# Patient Record
Sex: Male | Born: 1940 | Race: White | Hispanic: No | Marital: Married | Smoking: Never smoker
Health system: Southern US, Community
[De-identification: ages and names within clinical notes are randomized; demographics above are authoritative.]

## PROBLEM LIST (undated history)

## (undated) DIAGNOSIS — E7800 Pure hypercholesterolemia, unspecified: Secondary | ICD-10-CM

## (undated) DIAGNOSIS — I10 Essential (primary) hypertension: Secondary | ICD-10-CM

---

## 2017-07-30 ENCOUNTER — Emergency Department (HOSPITAL_COMMUNITY)
Admission: EM | Admit: 2017-07-30 | Discharge: 2017-07-30 | Disposition: A | Discharge status: Home / Self Care | Payer: Medicare Other | Attending: Emergency Medicine | Admitting: Emergency Medicine

## 2017-07-30 ENCOUNTER — Emergency Department (HOSPITAL_COMMUNITY): Payer: Medicare Other

## 2017-07-30 ENCOUNTER — Encounter (HOSPITAL_COMMUNITY): Payer: Self-pay | Admitting: *Deleted

## 2017-07-30 DIAGNOSIS — Y9389 Activity, other specified: Secondary | ICD-10-CM | POA: Insufficient documentation

## 2017-07-30 DIAGNOSIS — Y92009 Unspecified place in unspecified non-institutional (private) residence as the place of occurrence of the external cause: Secondary | ICD-10-CM | POA: Diagnosis not present

## 2017-07-30 DIAGNOSIS — Z79899 Other long term (current) drug therapy: Secondary | ICD-10-CM | POA: Insufficient documentation

## 2017-07-30 DIAGNOSIS — Y999 Unspecified external cause status: Secondary | ICD-10-CM | POA: Diagnosis not present

## 2017-07-30 DIAGNOSIS — S6992XA Unspecified injury of left wrist, hand and finger(s), initial encounter: Secondary | ICD-10-CM | POA: Insufficient documentation

## 2017-07-30 DIAGNOSIS — W2209XA Striking against other stationary object, initial encounter: Secondary | ICD-10-CM | POA: Diagnosis not present

## 2017-07-30 DIAGNOSIS — I10 Essential (primary) hypertension: Secondary | ICD-10-CM | POA: Diagnosis not present

## 2017-07-30 HISTORY — DX: Pure hypercholesterolemia, unspecified: E78.00

## 2017-07-30 HISTORY — DX: Essential (primary) hypertension: I10

## 2017-07-30 MED ORDER — CEPHALEXIN 250 MG PO CAPS
500.0000 mg | ORAL_CAPSULE | Freq: Once | ORAL | Status: AC
  Administered 2017-07-30: 500 mg via ORAL
  Filled 2017-07-30: qty 2

## 2017-07-30 MED ORDER — CEPHALEXIN 500 MG PO CAPS: 500.0000 mg | ORAL_CAPSULE | Freq: Four times a day (QID) | ORAL | 0 refills | Status: AC

## 2017-07-30 NOTE — ED Triage Notes (Signed)
Pt reports injuring left hand several days ago, had swelling and used epsom salt which increased the swelling. Now has redness and swelling noted to left hand and fingers. Needs his wedding ring cut off.

## 2017-07-30 NOTE — ED Notes (Signed)
Pt verbalized understanding of d/c instructions and has no further questions. Pt is stable, A&Ox4, VSS.  

## 2017-07-30 NOTE — Progress Notes (Signed)
Orthopedic Tech Progress Note Patient Details:  Randall Anorman Oler Jr. 08/19/1941 [id number]  Ortho Devices Type of Ortho Device: Volar splint Ortho Device/Splint Location: applied volar splint to pt left wrist.  pt tolerated application very well.  provided arm sling for support for left arm.   Ortho Device/Splint Interventions: Application, Adjustment   Alvina ChouWilliams, Hayzlee Mcsorley C 07/30/2017, 11:12 PM

## 2017-07-30 NOTE — ED Provider Notes (Signed)
[facility]-EMERGENCY DEPT Provider Note   CSN: [id number] Arrival date & time: 07/30/17  1236     History   Chief Complaint Chief Complaint  Patient presents with  . Hand Injury    HPI Riley Anorman Forinash Jr. is a 76 y.o. male.  This is a 76 year old male with PMH of HTN treated with hydrochlorthiazide who presents 1 week after suspected injury while working at home to his hand.  Patient states he was hammering a piece of wood and using his left hand to stabilize the wood when days later he noticed worsening left hand swelling.  He states he had no direct trauma from a hammer to his left hand however he suspected that the vibration from the force of hammering caused his left hand to swell. Patient states the swelling had worsened all week but stabilized from yesterday and has not increased in the past 48 hours. He wanted to be evaluated with concern for possible infectious etiology. He denies any sensation changes, denies any open wound to his left hand, denies other injuries.  Denies any nausea, vomiting, fevers, night sweats, chills.      Past Medical History:  Diagnosis Date  . High cholesterol   . Hypertension     There are no active problems to display for this patient.   History reviewed. No pertinent surgical history.     Home Medications    Prior to Admission medications   Medication Sig Start Date End Date Taking? Authorizing Provider  atenolol (TENORMIN) 50 MG tablet Take 50 mg by mouth daily. 05/25/17  Yes [provider]  hydrochlorothiazide (HYDRODIURIL) 25 MG tablet Take 25 mg by mouth daily. 06/25/17  Yes [provider]  ibuprofen (ADVIL,MOTRIN) 200 MG tablet Take 400 mg by mouth every 6 (six) hours as needed for mild pain or moderate pain.   Yes [provider]  Magnesium Sulfate (EPSOM SALT) POWD Apply 1 application topically daily as needed (for hand swelling).   Yes [provider]  Menthol-Camphor (TIGER BALM ARTHRITIS RUB) 11-11  % CREA Apply 1 application topically daily as needed (for hand swelling).   Yes [provider]  pravastatin (PRAVACHOL) 20 MG tablet Take 10 mg by mouth daily.  02/15/17 02/15/18 Yes [provider]  cephALEXin (KEFLEX) 500 MG capsule Take 1 capsule (500 mg total) by mouth 4 (four) times daily. 07/30/17 08/06/17  Shaune PollackBriggs, Raia Amico, MD    Family History History reviewed. No pertinent family history.  Social History Social History  Substance Use Topics  . Smoking status: Never Smoker  . Smokeless tobacco: Not on file  . Alcohol use Yes     Comment: occ     Allergies   Patient has no known allergies.   Review of Systems Review of Systems  Constitutional: Negative for chills, diaphoresis and fever.  HENT: Negative for ear pain and sore throat.   Eyes: Negative for pain and visual disturbance.  Respiratory: Negative for cough, chest tightness and shortness of breath.   Cardiovascular: Negative for chest pain, palpitations and leg swelling.  Gastrointestinal: Negative for abdominal pain and vomiting.  Genitourinary: Negative for dysuria and hematuria.  Musculoskeletal: Negative for arthralgias, back pain, gait problem, joint swelling, myalgias, neck pain and neck stiffness.  Skin: Positive for color change. Negative for pallor, rash and wound.  Neurological: Negative for dizziness, seizures, syncope and headaches.  All other systems reviewed and are negative.    Physical Exam Updated Vital Signs BP (!) 154/86   Pulse 60  Temp (!) 97.5 F (36.4 C) (Oral)   Resp 18   SpO2 100%   Physical Exam  Constitutional: He appears well-developed and well-nourished.  HENT:  Head: Normocephalic and atraumatic.  Eyes: Pupils are equal, round, and reactive to light. Conjunctivae are normal.  Neck: Neck supple.  Cardiovascular: Normal rate and regular rhythm.   No murmur heard. Pulmonary/Chest: Effort normal and breath sounds normal. No respiratory distress.  Abdominal:  Soft. There is no tenderness.  Musculoskeletal: He exhibits no edema.       Right wrist: Normal.       Left wrist: Normal.       Right forearm: Normal.       Left forearm: Normal.       Right hand: Normal.       Left hand: He exhibits decreased range of motion and tenderness. He exhibits no bony tenderness. Normal sensation noted. Decreased strength noted.  Neurological: He is alert.  Skin: Skin is warm and dry.  Psychiatric: He has a normal mood and affect.  Nursing note and vitals reviewed.  ED Treatments / Results  Labs (all labs ordered are listed, but only abnormal results are displayed) Labs Reviewed - No data to display  EKG  EKG Interpretation None       Radiology Dg Hand Complete Left  Result Date: 07/30/2017 CLINICAL DATA:  Left hand redness and swelling after injury a few days ago. EXAM: LEFT HAND - COMPLETE 3+ VIEW COMPARISON:  None. FINDINGS: No acute fracture or malalignment. Mild degenerative changes of the first [facility] joint. Diffuse soft tissue swelling of the hand and digits, sparing the small finger. Bone mineralization is normal. IMPRESSION: Diffuse soft tissue swelling of the hand and digits, sparing the small finger. No acute osseous abnormality. Electronically Signed   By: Obie Dredge M.D.   On: 07/30/2017 13:22    Procedures Procedures (including critical care time)  Medications Ordered in ED Medications  cephALEXin (KEFLEX) capsule 500 mg (500 mg Oral Given 07/30/17 2151)     Initial Impression / Assessment and Plan / ED Course  I have reviewed the triage vital signs and the nursing notes.  Pertinent labs & imaging results that were available during my care of the patient were reviewed by me and considered in my medical decision making (see chart for details).     This is a 76 year old male with PMH of HTN treated with hydrochlorthiazide who presents 1 week after suspected injury while working at home to his hand.  On exam patient has limited  range of motion only by his left hand swelling.  Capillary refill less than 2 in all digits.  Finger thumb opposition unchanged, patient unable to form a tight fist due to swelling.  Pulses strong and intact.  Sensation intact.  Left hand x-ray unremarkable for bony pathology or malalignment. Ring removed due to swelling, patient reports decreased pain.  Patient has minimal tenderness to palpation of the dorsal and palmar aspect of his left hand.  Patient was instructed to continue ibuprofen and given proper dosing instructions to maximize reduction in swelling and pain.  Keflex given for possible soft tissue infection.  Splint applied for comfort. Follow-up with PCP as advised, return precautions given.  All questions answered.  Final Clinical Impressions(s) / ED Diagnoses   Final diagnoses:  Injury of left hand, initial encounter    New Prescriptions Discharge Medication List as of 07/30/2017 10:25 PM       Shaune Pollack, MD 07/30/17 2322  Margarita Grizzle, MD 07/31/17 2207

## 2018-02-10 IMAGING — DX DG HAND COMPLETE 3+V*L*
3 series · 3 of 3 positions shown · non-contrast
Comparison: None.

CLINICAL DATA: Left hand redness and swelling after injury a few
days ago.

EXAM:
LEFT HAND - COMPLETE 3+ VIEW

[hand pa]
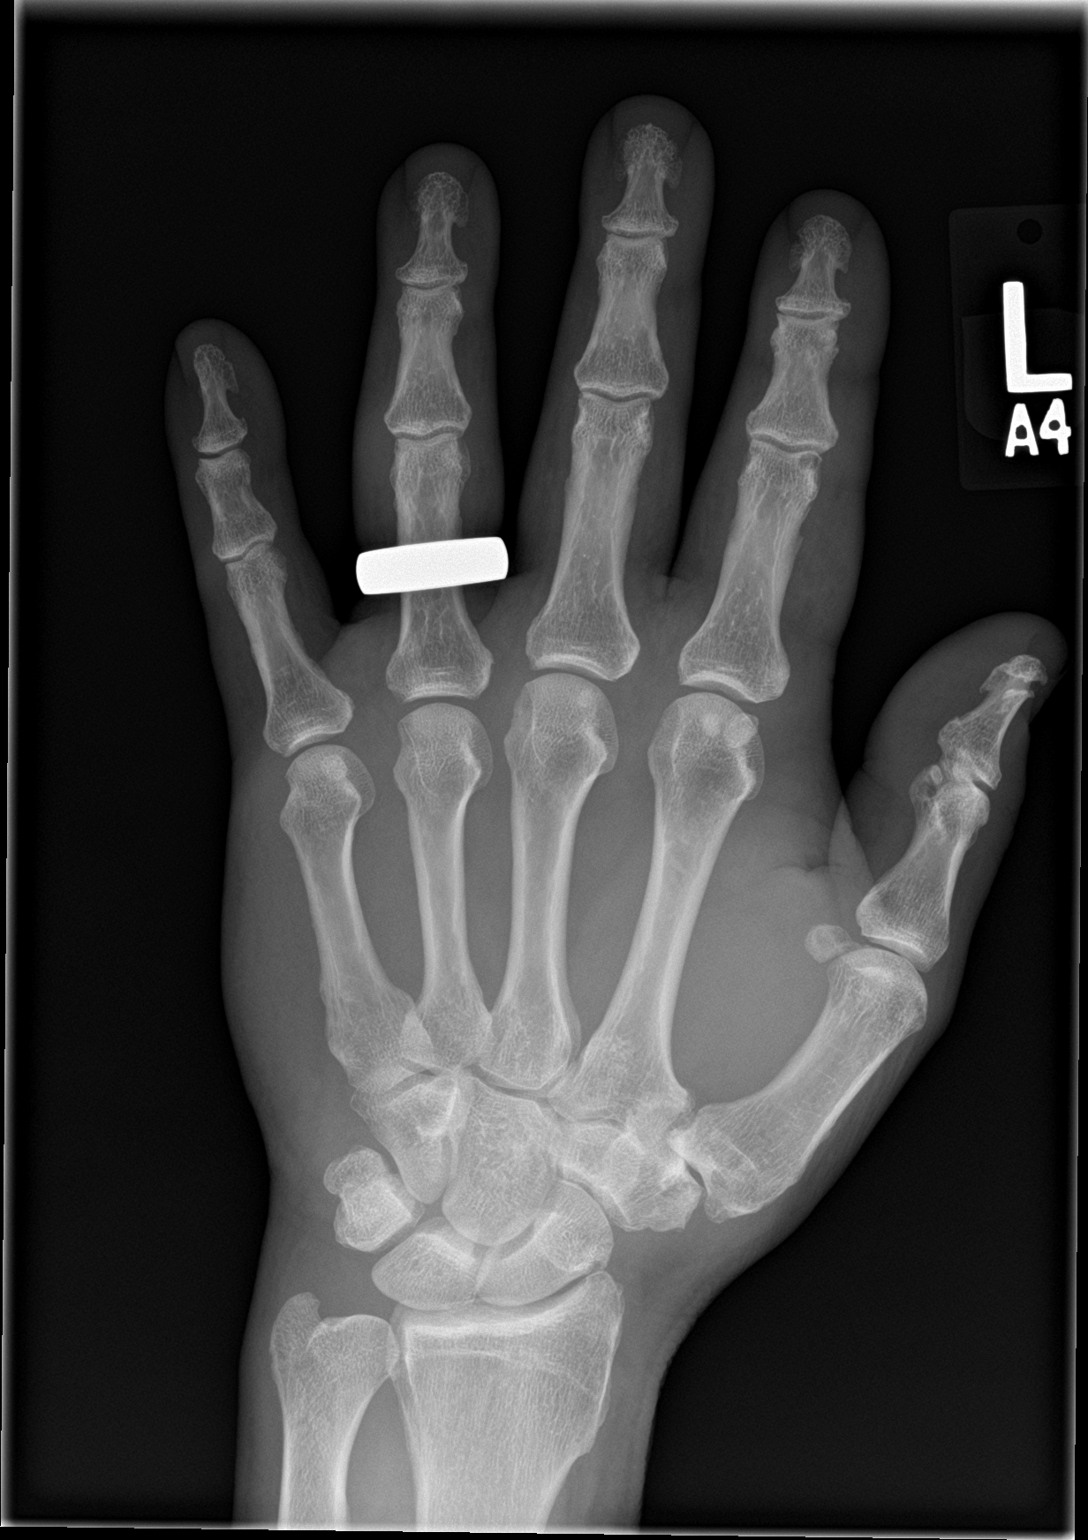

[hand obl]
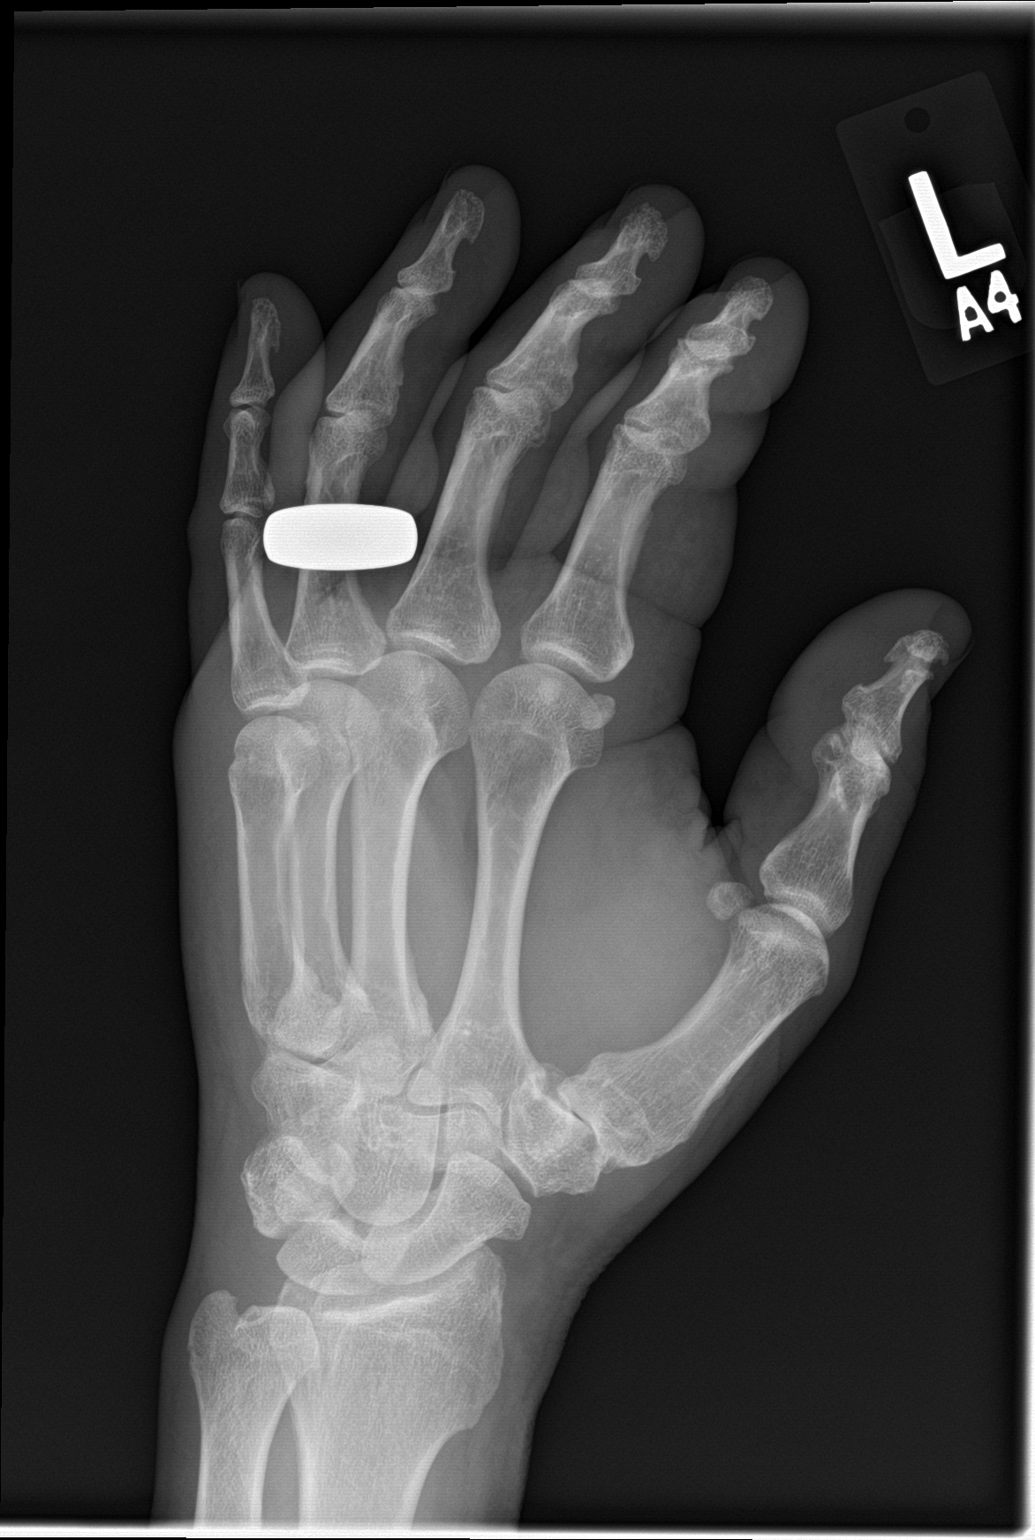

[hand lat]
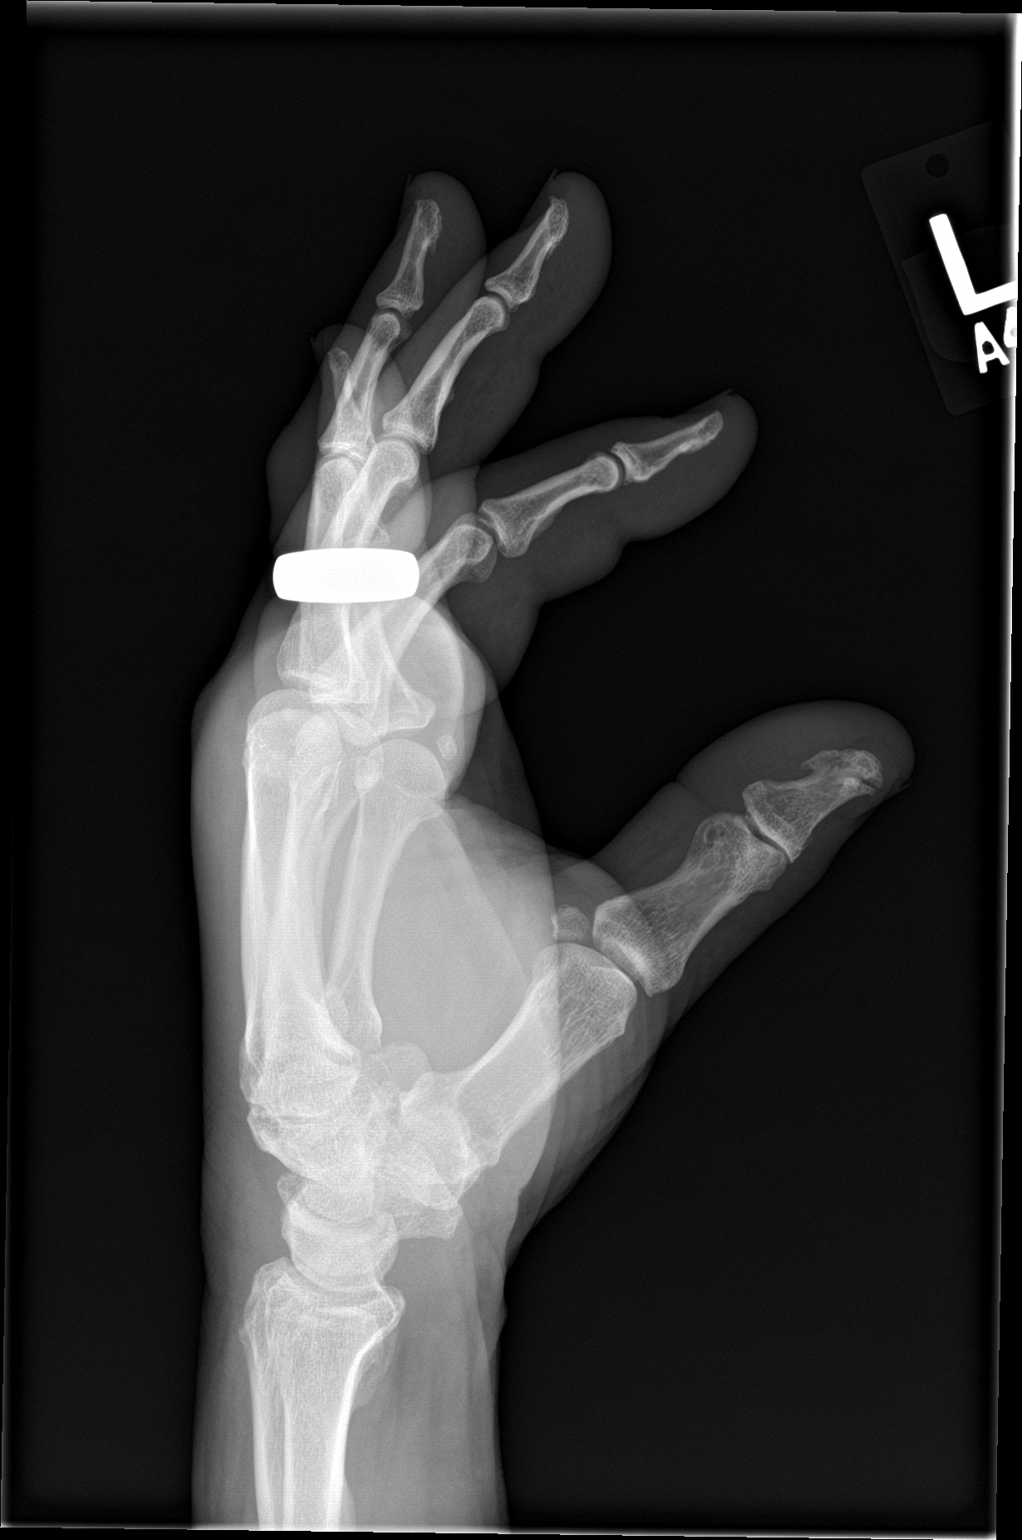

[3 of 3 positions shown; findings below may reference images not displayed]

FINDINGS: No acute fracture or malalignment. Mild degenerative changes of the
first [facility] joint. Diffuse soft tissue swelling of the hand and
digits, sparing the small finger. Bone mineralization is normal.
IMPRESSION: Diffuse soft tissue swelling of the hand and digits, sparing the
small finger. No acute osseous abnormality.

## 2019-12-30 ENCOUNTER — Ambulatory Visit: Payer: Medicare Other
